# Patient Record
Sex: Female | Born: 1950 | Race: White | Hispanic: No | Marital: Single | Smoking: Never smoker
Health system: Southern US, Community
[De-identification: ages and names within clinical notes are randomized; demographics above are authoritative.]

## PROBLEM LIST (undated history)

## (undated) DIAGNOSIS — E119 Type 2 diabetes mellitus without complications: Secondary | ICD-10-CM

## (undated) DIAGNOSIS — K635 Polyp of colon: Secondary | ICD-10-CM

## (undated) DIAGNOSIS — E785 Hyperlipidemia, unspecified: Secondary | ICD-10-CM

## (undated) DIAGNOSIS — I10 Essential (primary) hypertension: Secondary | ICD-10-CM

## (undated) HISTORY — PX: OTHER SURGICAL HISTORY: SHX169

## (undated) HISTORY — PX: COLONOSCOPY: SHX174

---

## 2011-02-17 ENCOUNTER — Emergency Department: Payer: Self-pay | Admitting: *Deleted

## 2012-07-10 ENCOUNTER — Emergency Department: Payer: Self-pay | Admitting: Emergency Medicine

## 2012-07-10 LAB — CBC
HCT: 37.5 % (ref 35.0–47.0)
HGB: 13 g/dL (ref 12.0–16.0)
MCV: 89 fL (ref 80–100)
Platelet: 266 10*3/uL (ref 150–440)
RBC: 4.2 10*6/uL (ref 3.80–5.20)
RDW: 13.3 % (ref 11.5–14.5)
WBC: 6.5 10*3/uL (ref 3.6–11.0)

## 2012-07-10 LAB — BASIC METABOLIC PANEL
Anion Gap: 7 (ref 7–16)
Calcium, Total: 8.8 mg/dL (ref 8.5–10.1)
Chloride: 104 mmol/L (ref 98–107)
Co2: 28 mmol/L (ref 21–32)
Creatinine: 0.76 mg/dL (ref 0.60–1.30)
EGFR (African American): >60
EGFR (Non-African Amer.): >60
Glucose: 102 mg/dL — ABNORMAL HIGH (ref 65–99)
Osmolality: 279 (ref 275–301)
Sodium: 139 mmol/L (ref 136–145)

## 2012-07-10 LAB — TROPONIN I: Troponin-I: <0.02 ng/mL

## 2012-07-10 LAB — CK TOTAL AND CKMB (NOT AT ARMC): CK, Total: 65 U/L (ref 21–215)

## 2014-01-19 IMAGING — CR DG CHEST 2V
1 series · 2 of 2 positions shown · non-contrast
Comparison: none

REASON FOR EXAM: chest pain
COMMENTS:

PROCEDURE:     DXR - DXR CHEST PA (OR AP) AND LATERAL  - July 10, 2012 [DATE]
RESULT:     The lungs are clear. The heart and pulmonary vessels are normal.
The bony and mediastinal structures are unremarkable. There is no effusion.
There is no pneumothorax or evidence of congestive failure.

[Series 1: w chest pa · 0.14mm/px · 2 of 2 slices shown]
[im 1/2]
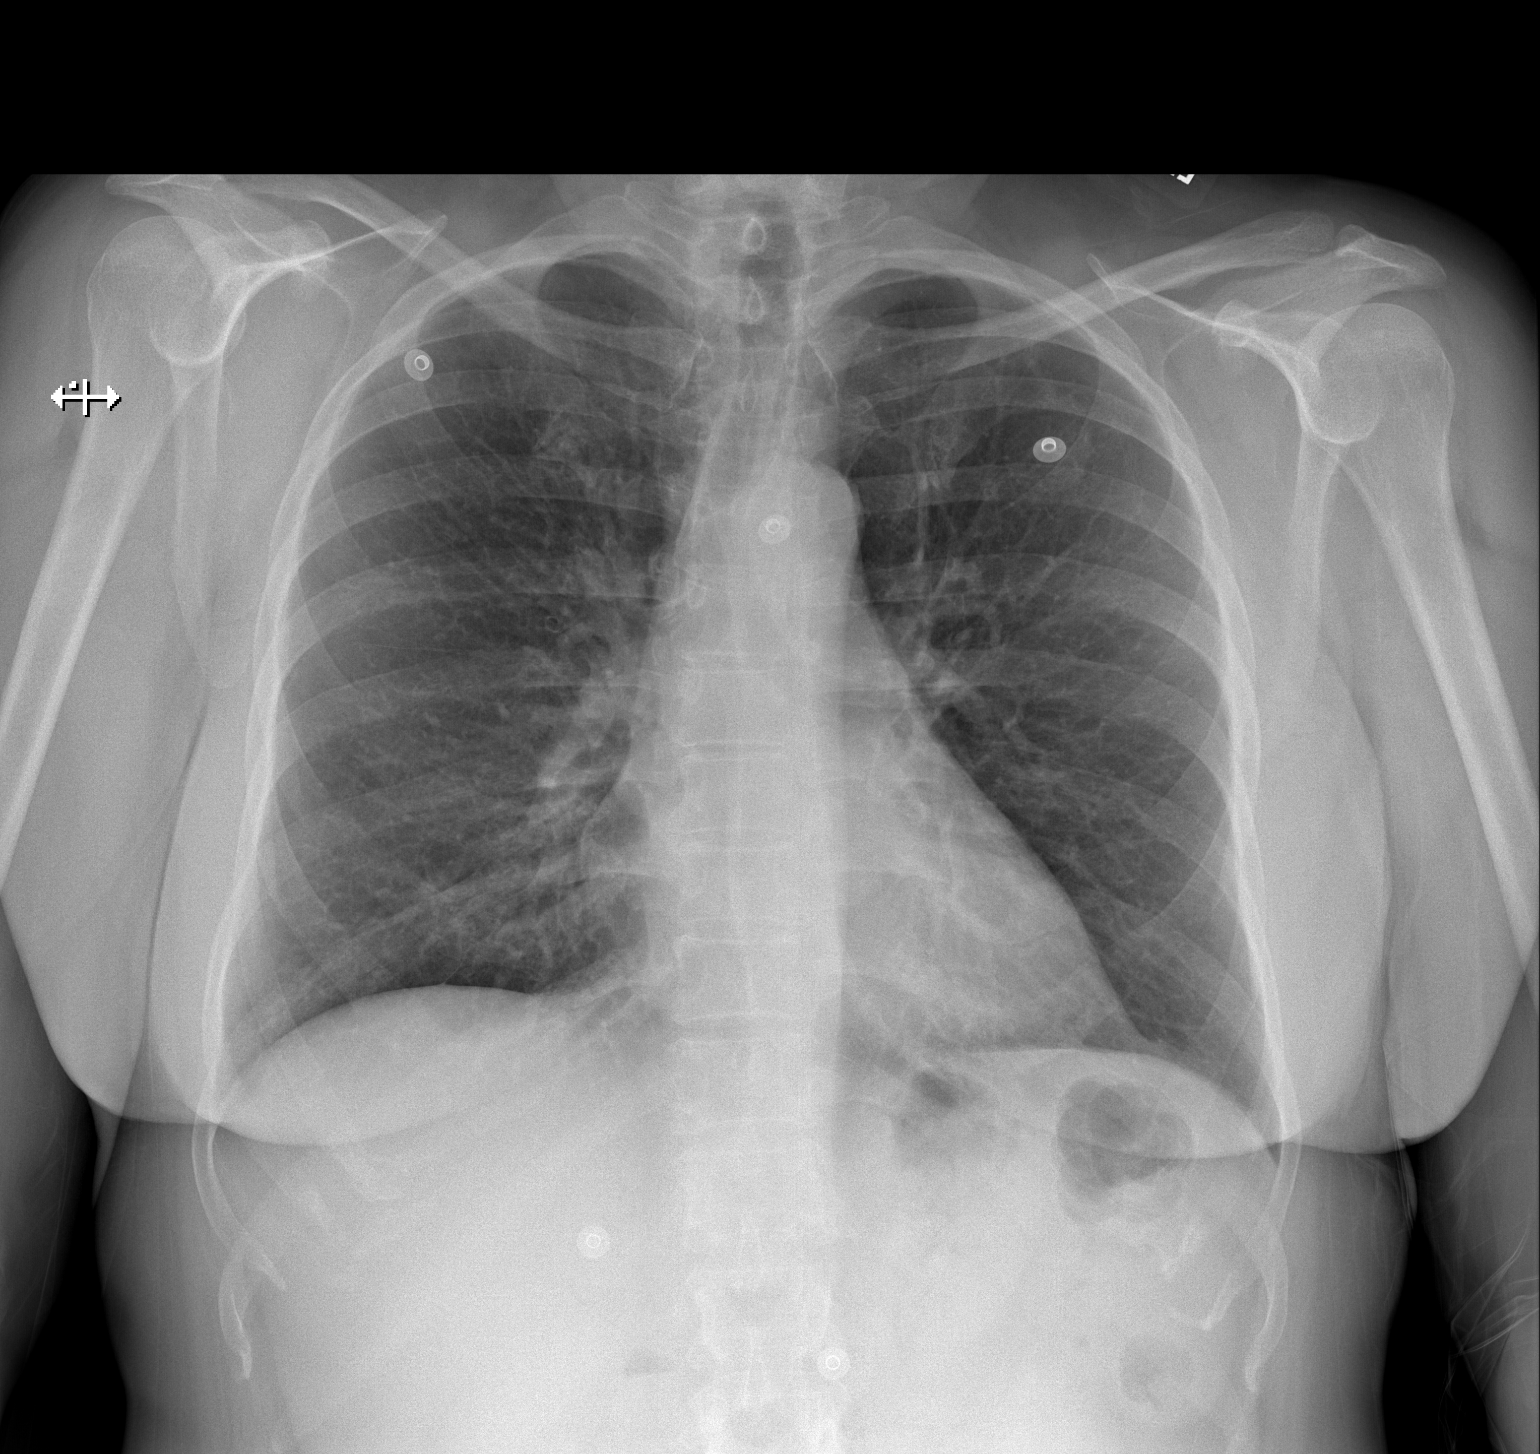
[im 2/2]
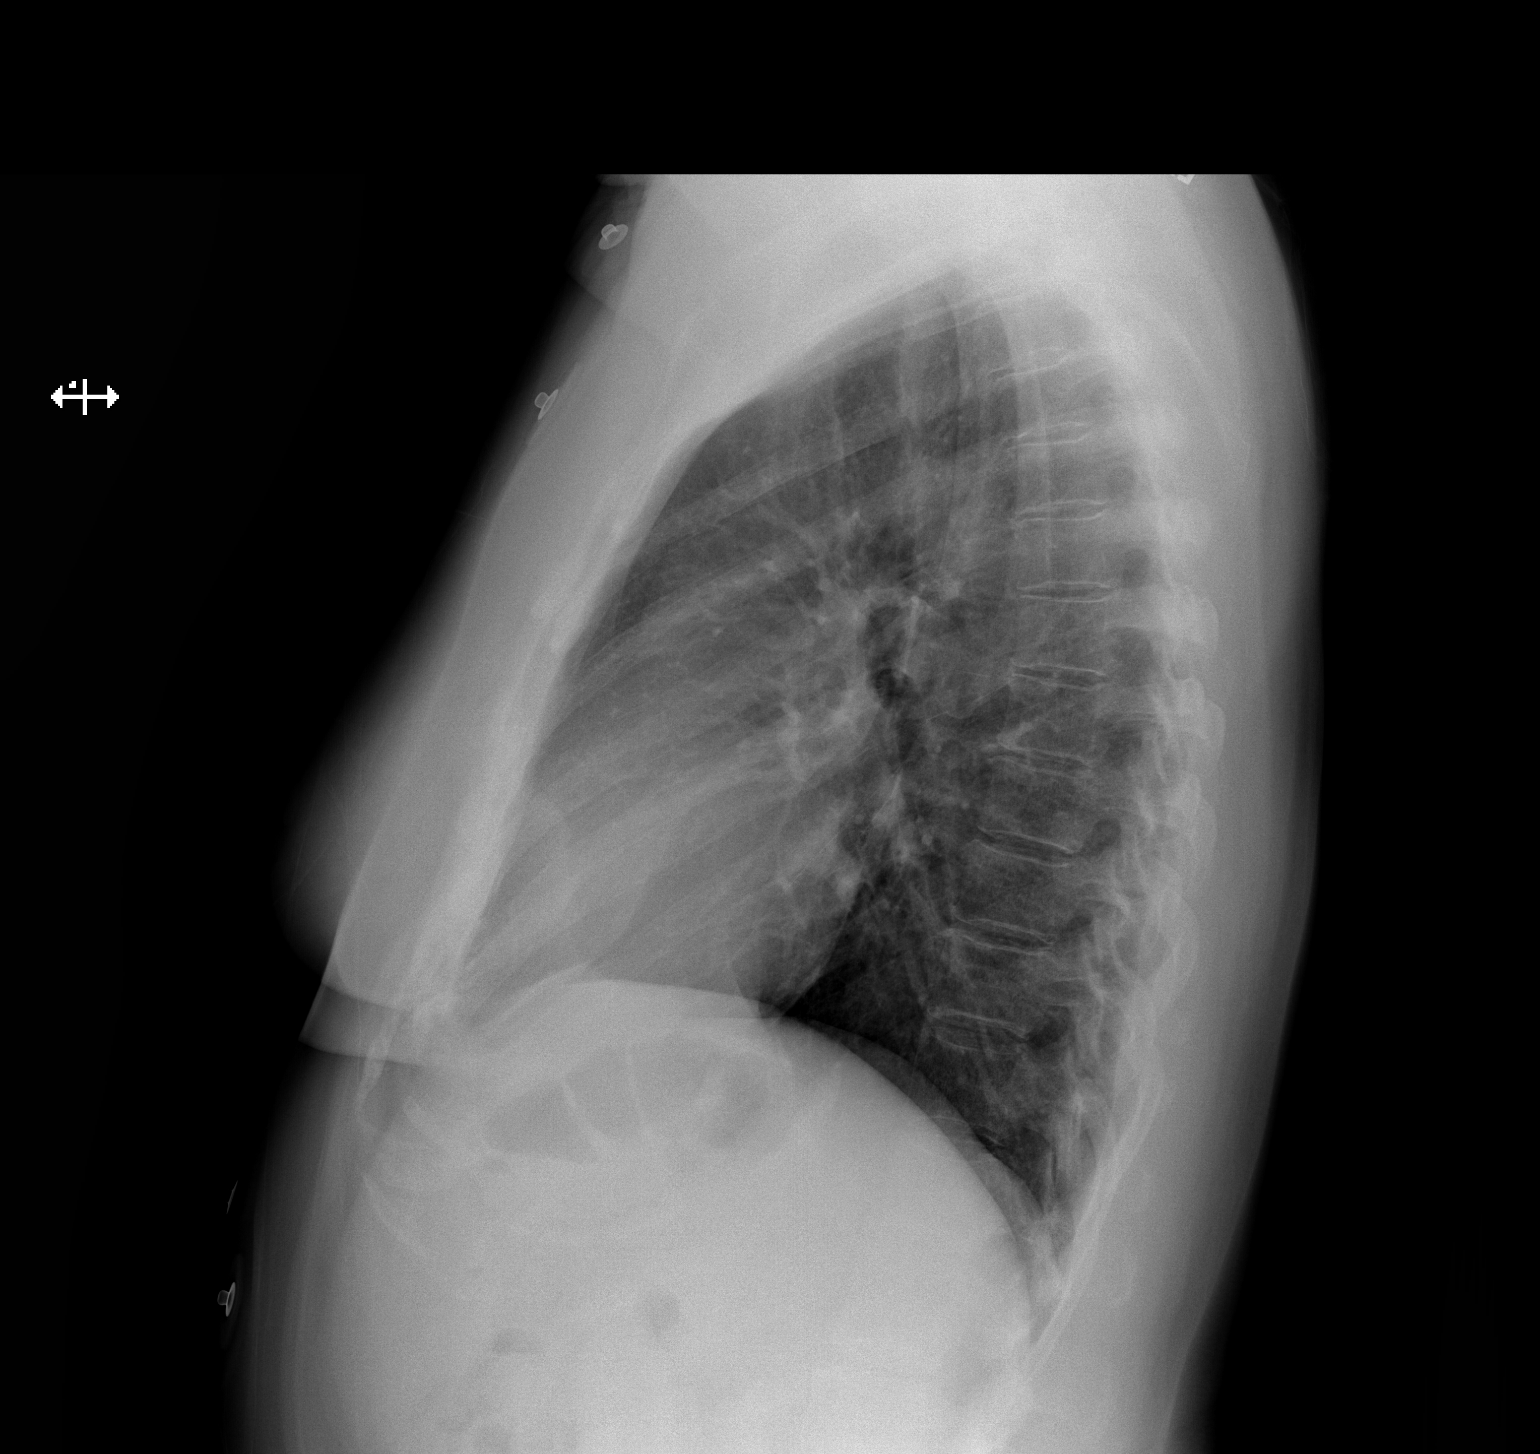

[2 of 2 positions shown; findings below may reference images not displayed]

IMPRESSION: No acute cardiopulmonary disease.

[REDACTED]

## 2015-01-24 ENCOUNTER — Other Ambulatory Visit: Payer: Self-pay | Admitting: Nurse Practitioner

## 2015-01-24 DIAGNOSIS — Z1231 Encounter for screening mammogram for malignant neoplasm of breast: Secondary | ICD-10-CM

## 2015-04-09 ENCOUNTER — Encounter: Payer: Self-pay | Admitting: *Deleted

## 2015-04-10 ENCOUNTER — Ambulatory Visit: Payer: No Typology Code available for payment source | Admitting: Certified Registered Nurse Anesthetist

## 2015-04-10 ENCOUNTER — Encounter: Payer: Self-pay | Admitting: *Deleted

## 2015-04-10 ENCOUNTER — Encounter
Admission: RE | Disposition: A | Discharge status: Home / Self Care | Payer: Self-pay | Source: Ambulatory Visit | Attending: Gastroenterology

## 2015-04-10 ENCOUNTER — Ambulatory Visit
Admission: RE | Admit: 2015-04-10 | Discharge: 2015-04-10 | Disposition: A | Discharge status: Home / Self Care | Payer: No Typology Code available for payment source | Source: Ambulatory Visit | Attending: Gastroenterology | Admitting: Gastroenterology

## 2015-04-10 DIAGNOSIS — K644 Residual hemorrhoidal skin tags: Secondary | ICD-10-CM | POA: Diagnosis not present

## 2015-04-10 DIAGNOSIS — I10 Essential (primary) hypertension: Secondary | ICD-10-CM | POA: Diagnosis not present

## 2015-04-10 DIAGNOSIS — E119 Type 2 diabetes mellitus without complications: Secondary | ICD-10-CM | POA: Diagnosis not present

## 2015-04-10 DIAGNOSIS — Z800 Family history of malignant neoplasm of digestive organs: Secondary | ICD-10-CM | POA: Insufficient documentation

## 2015-04-10 DIAGNOSIS — Z79899 Other long term (current) drug therapy: Secondary | ICD-10-CM | POA: Diagnosis not present

## 2015-04-10 DIAGNOSIS — Z86010 Personal history of colonic polyps: Secondary | ICD-10-CM | POA: Insufficient documentation

## 2015-04-10 DIAGNOSIS — E785 Hyperlipidemia, unspecified: Secondary | ICD-10-CM | POA: Insufficient documentation

## 2015-04-10 DIAGNOSIS — K5730 Diverticulosis of large intestine without perforation or abscess without bleeding: Secondary | ICD-10-CM | POA: Insufficient documentation

## 2015-04-10 DIAGNOSIS — R195 Other fecal abnormalities: Secondary | ICD-10-CM | POA: Insufficient documentation

## 2015-04-10 DIAGNOSIS — Z7984 Long term (current) use of oral hypoglycemic drugs: Secondary | ICD-10-CM | POA: Insufficient documentation

## 2015-04-10 HISTORY — DX: Hyperlipidemia, unspecified: E78.5

## 2015-04-10 HISTORY — PX: COLONOSCOPY WITH PROPOFOL: SHX5780

## 2015-04-10 HISTORY — DX: Essential (primary) hypertension: I10

## 2015-04-10 HISTORY — DX: Polyp of colon: K63.5

## 2015-04-10 HISTORY — DX: Type 2 diabetes mellitus without complications: E11.9

## 2015-04-10 LAB — GLUCOSE, CAPILLARY: Glucose-Capillary: 98 mg/dL (ref 65–99)

## 2015-04-10 SURGERY — COLONOSCOPY WITH PROPOFOL
Anesthesia: General

## 2015-04-10 MED ORDER — SODIUM CHLORIDE 0.9 % IV SOLN
INTRAVENOUS | Status: DC
  Administered 2015-04-10: 11:00:00 via INTRAVENOUS

## 2015-04-10 MED ORDER — PROPOFOL 10 MG/ML IV BOLUS
INTRAVENOUS | Status: DC | PRN
  Administered 2015-04-10: 60 mg via INTRAVENOUS

## 2015-04-10 MED ORDER — PROPOFOL 500 MG/50ML IV EMUL
INTRAVENOUS | Status: DC | PRN
  Administered 2015-04-10: 140 ug/kg/min via INTRAVENOUS

## 2015-04-10 MED ORDER — SODIUM CHLORIDE 0.9 % IV SOLN: INTRAVENOUS | Status: DC

## 2015-04-10 NOTE — Op Note (Signed)
[facility] Gastroenterology Patient Name: Suzanne Gardner Procedure Date: 04/10/2015 10:58 AM MRN: [id number] Account #: [id number] Date of Birth: 30-Sep-1950 Admit Type: Outpatient Age: 64 Room: [facility] ENDO ROOM 4 Gender: Female Note Status: Finalized Procedure:         Colonoscopy Indications:       Heme positive stool, Family history of colon cancer in a                     first-degree relative, Personal history of colonic polyps Providers:         Ezzard Standing. Bluford Kaufmann, MD Referring MD:      Valentina Shaggy. White (Referring MD) Medicines:         Monitored Anesthesia Care Complications:     No immediate complications. Procedure:         Pre-Anesthesia Assessment:                    - Prior to the procedure, a History and Physical was                     performed, and patient medications, allergies and                     sensitivities were reviewed. The patient's tolerance of                     previous anesthesia was reviewed.                    - The risks and benefits of the procedure and the sedation                     options and risks were discussed with the patient. All                     questions were answered and informed consent was obtained.                    - After reviewing the risks and benefits, the patient was                     deemed in satisfactory condition to undergo the procedure.                    After obtaining informed consent, the colonoscope was                     passed under direct vision. Throughout the procedure, the                     patient's blood pressure, pulse, and oxygen saturations                     were monitored continuously. The Colonoscope was                     introduced through the anus and advanced to the the cecum,                     identified by appendiceal orifice and ileocecal valve. The                     colonoscopy was performed without difficulty. The patient  tolerated the procedure  well. The quality of the bowel                     preparation was good. Findings:      The perianal exam findings include non-thrombosed external hemorrhoids.      A few small-mouthed diverticula were found in the sigmoid colon.      The exam was otherwise without abnormality. Impression:        - Non-thrombosed external hemorrhoids found on perianal                     exam.                    - Diverticulosis in the sigmoid colon.                    - The examination was otherwise normal.                    - No specimens collected. Recommendation:    - Discharge patient to home.                    - Repeat colonoscopy in 5 years for surveillance.                    - The findings and recommendations were discussed with the                     patient. Procedure Code(s): --- Professional ---                    [id number], Colonoscopy, flexible; diagnostic, including                     collection of specimen(s) by brushing or washing, when                     performed (separate procedure) Diagnosis Code(s): --- Professional ---                    K64.4, Residual hemorrhoidal skin tags                    R19.5, Other fecal abnormalities                    Z80.0, Family history of malignant neoplasm of digestive                     organs                    Z86.010, Personal history of colonic polyps                    K57.30, Diverticulosis of large intestine without                     perforation or abscess without bleeding CPT copyright 2014 American [employer] Association. All rights reserved. The codes documented in this report are preliminary and upon coder review may  be revised to meet current compliance requirements. Wallace CullensPaul Y Kista Robb, MD 04/10/2015 11:16:20 AM This report has been signed electronically. Number of Addenda: 0 Note Initiated On: 04/10/2015 10:58 AM Scope Withdrawal Time: 0 hours 5 minutes 40 seconds  Total Procedure Duration: 0 hours 11 minutes 10 seconds  [facility]

## 2015-04-10 NOTE — Anesthesia Preprocedure Evaluation (Signed)
Anesthesia Evaluation  Patient identified by MRN, date of birth, ID band Patient awake    Reviewed: Allergy & Precautions, H&P , NPO status , Patient's Chart, lab work & pertinent test results  History of Anesthesia Complications Negative for: history of anesthetic complications  Airway Mallampati: III  TM Distance: >3 FB Neck ROM: limited    Dental  (+) Poor Dentition, Chipped, Missing   Pulmonary neg shortness of breath, sleep apnea ,    Pulmonary exam normal breath sounds clear to auscultation       Cardiovascular Exercise Tolerance: Good hypertension, (-) angina(-) DOE Normal cardiovascular exam(-) Valvular Problems/Murmurs Rhythm:regular Rate:Normal     Neuro/Psych negative neurological ROS  negative psych ROS   GI/Hepatic negative GI ROS, Neg liver ROS,   Endo/Other  diabetes, Type 2  Renal/GU negative Renal ROS  negative genitourinary   Musculoskeletal   Abdominal   Peds  Hematology negative hematology ROS (+)   Anesthesia Other Findings Past Medical History:   Diabetes mellitus without complication (HCC)                 Hypertension                                                 Hyperlipidemia                                               Colon polyps                                                Past Surgical History:   cervix stitched                                               COLONOSCOPY                                                  BMI    Body Mass Index   31.83 kg/m 2      Reproductive/Obstetrics negative OB ROS                             Anesthesia Physical Anesthesia Plan  ASA: III  Anesthesia Plan: General   Post-op Pain Management:    Induction:   Airway Management Planned:   Additional Equipment:   Intra-op Plan:   Post-operative Plan:   Informed Consent: I have reviewed the patients History and Physical, chart, labs and discussed the  procedure including the risks, benefits and alternatives for the proposed anesthesia with the patient or authorized representative who has indicated his/her understanding and acceptance.   Dental Advisory Given  Plan Discussed with: Anesthesiologist, CRNA and Surgeon  Anesthesia Plan Comments:         Anesthesia Quick Evaluation

## 2015-04-10 NOTE — Transfer of Care (Signed)
Immediate Anesthesia Transfer of Care Note  Patient: Suzanne RiasCathy Bou  Procedure(s) Performed: Procedure(s): COLONOSCOPY WITH PROPOFOL (N/A)  Patient Location: PACU  Anesthesia Type:General  Level of Consciousness: awake, alert  and oriented  Airway & Oxygen Therapy: Patient Spontanous Breathing  Post-op Assessment: Report given to RN  Post vital signs: Reviewed and stable  Last Vitals:  Filed Vitals:   04/10/15 1117  BP: 132/63  Pulse: 75  Temp: 36.1 C  Resp: 24    Complications: No apparent anesthesia complications

## 2015-04-10 NOTE — Anesthesia Postprocedure Evaluation (Signed)
  Anesthesia Post-op Note  Patient: Suzanne RiasCathy Gardner  Procedure(s) Performed: Procedure(s): COLONOSCOPY WITH PROPOFOL (N/A)  Anesthesia type:General  Patient location: PACU  Post pain: Pain level controlled  Post assessment: Post-op Vital signs reviewed, Patient's Cardiovascular Status Stable, Respiratory Function Stable, Patent Airway and No signs of Nausea or vomiting  Post vital signs: Reviewed and stable  Last Vitals:  Filed Vitals:   04/10/15 1120  BP: 132/63  Pulse: 75  Temp: 36 C  Resp: 19    Level of consciousness: awake, alert  and patient cooperative  Complications: No apparent anesthesia complications

## 2015-04-10 NOTE — Anesthesia Procedure Notes (Signed)
Performed by: Ginger CarneMICHELET, Edgar Reisz Pre-anesthesia Checklist: Patient identified and Emergency Drugs available Patient Re-evaluated:Patient Re-evaluated prior to inductionOxygen Delivery Method: Nasal cannula Placement Confirmation: positive ETCO2

## 2015-04-10 NOTE — H&P (Signed)
    Primary Care Physician:  WHITE, Valentina ShaggyHRISTINA M, FNP Primary Gastroenterologist:  Dr. Bluford Kaufmannh  Pre-Procedure History & Physical: HPI:  Suzanne Gardner is a 64 y.o. female is here for an colonoscopy.   Past Medical History  Diagnosis Date  . Diabetes mellitus without complication (HCC)   . Hypertension   . Hyperlipidemia   . Colon polyps     Past Surgical History  Procedure Laterality Date  . Cervix stitched    . Colonoscopy      Prior to Admission medications   Medication Sig Start Date End Date Taking? Authorizing Provider  cetirizine (ZYRTEC) 10 MG tablet Take 10 mg by mouth daily.   Yes Historical Provider, MD  hydrochlorothiazide (HYDRODIURIL) 25 MG tablet Take 25 mg by mouth daily.   Yes Historical Provider, MD  lisinopril (PRINIVIL,ZESTRIL) 10 MG tablet Take 10 mg by mouth daily.   Yes Historical Provider, MD  metFORMIN (GLUCOPHAGE) 500 MG tablet Take by mouth 2 (two) times daily with a meal.   Yes Historical Provider, MD  pravastatin (PRAVACHOL) 20 MG tablet Take 20 mg by mouth daily.   Yes Historical Provider, MD    Allergies as of 03/11/2015  . (Not on File)    History reviewed. No pertinent family history.  Social History   Social History  . Marital Status: Single    Spouse Name: N/A  . Number of Children: N/A  . Years of Education: N/A   Occupational History  . Not on file.   Social History Main Topics  . Smoking status: Never Smoker   . Smokeless tobacco: Never Used  . Alcohol Use: No  . Drug Use: No  . Sexual Activity: Not on file   Other Topics Concern  . Not on file   Social History Narrative    Review of Systems: See HPI, otherwise negative ROS  Physical Exam: BP 150/73 mmHg  Pulse 70  Temp(Src) 97.5 F (36.4 C) (Tympanic)  Resp 12  Ht 5' (1.524 m)  Wt 73.936 kg (163 lb)  BMI 31.83 kg/m2  SpO2 100% General:   Alert,  pleasant and cooperative in NAD Head:  Normocephalic and atraumatic. Neck:  Supple; no masses or thyromegaly. Lungs:   Clear throughout to auscultation.    Heart:  Regular rate and rhythm. Abdomen:  Soft, nontender and nondistended. Normal bowel sounds, without guarding, and without rebound.   Neurologic:  Alert and  oriented x4;  grossly normal neurologically.  Impression/Plan: Suzanne Gardner is here for an colonoscopy to be performed for personal hx of colon polyps, family hx of colon cancer, and heme positive stool.  Risks, benefits, limitations, and alternatives regarding  colonoscopy have been reviewed with the patient.  Questions have been answered.  All parties agreeable.   Jaire Pinkham, Ezzard StandingPAUL Y, MD  04/10/2015, 10:05 AM

## 2015-04-11 ENCOUNTER — Encounter: Payer: Self-pay | Admitting: Gastroenterology

## 2015-10-31 ENCOUNTER — Other Ambulatory Visit: Payer: Self-pay | Admitting: Preventative Medicine

## 2015-10-31 DIAGNOSIS — Z1231 Encounter for screening mammogram for malignant neoplasm of breast: Secondary | ICD-10-CM

## 2015-12-08 ENCOUNTER — Other Ambulatory Visit: Payer: Self-pay | Admitting: Preventative Medicine

## 2015-12-08 ENCOUNTER — Ambulatory Visit
Admission: RE | Admit: 2015-12-08 | Discharge: 2015-12-08 | Disposition: A | Discharge status: Home / Self Care | Payer: BLUE CROSS/BLUE SHIELD | Source: Ambulatory Visit | Attending: Preventative Medicine | Admitting: Preventative Medicine

## 2015-12-08 DIAGNOSIS — Z1231 Encounter for screening mammogram for malignant neoplasm of breast: Secondary | ICD-10-CM | POA: Diagnosis not present

## 2017-06-18 IMAGING — MG MM DIGITAL SCREENING BILAT W/ TOMO W/ CAD
9 of 17 series · 9 of 40 positions shown · non-contrast
Comparison: Prior mammograms dated 04/15/2005 and 08/07/2009.

CLINICAL DATA: Screening.

EXAM:
2D DIGITAL SCREENING BILATERAL MAMMOGRAM WITH CAD AND ADJUNCT TOMO

[R CC synth-2D (1 of 2)]
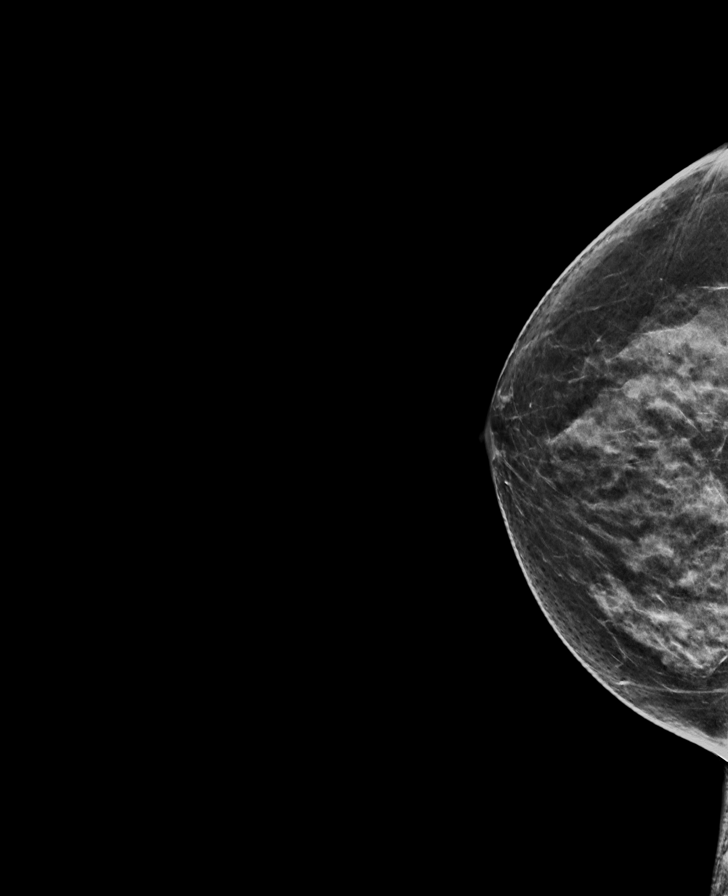

[L CC synth-2D]
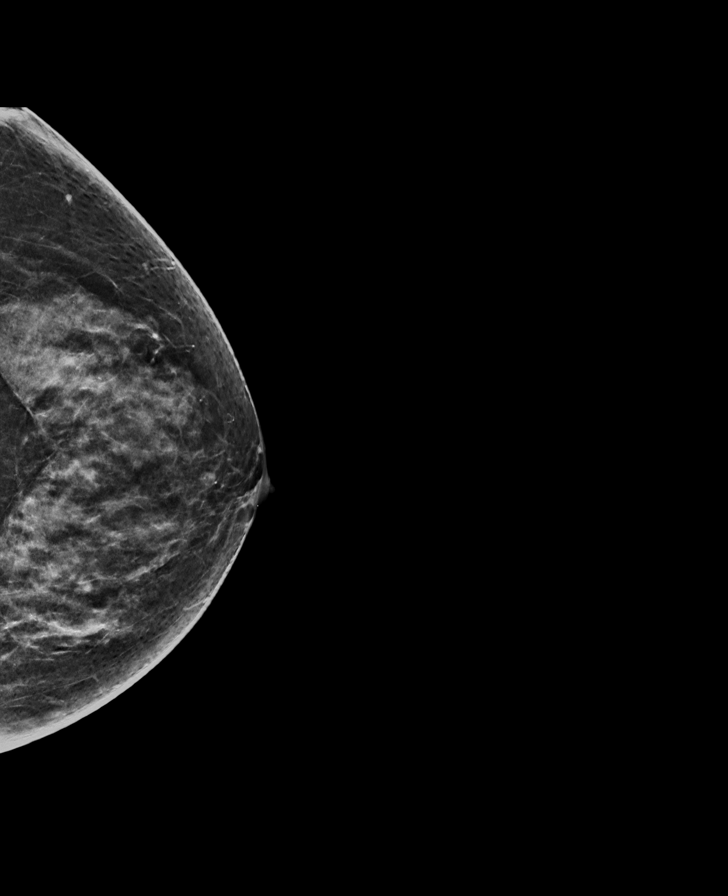

[R MLO]
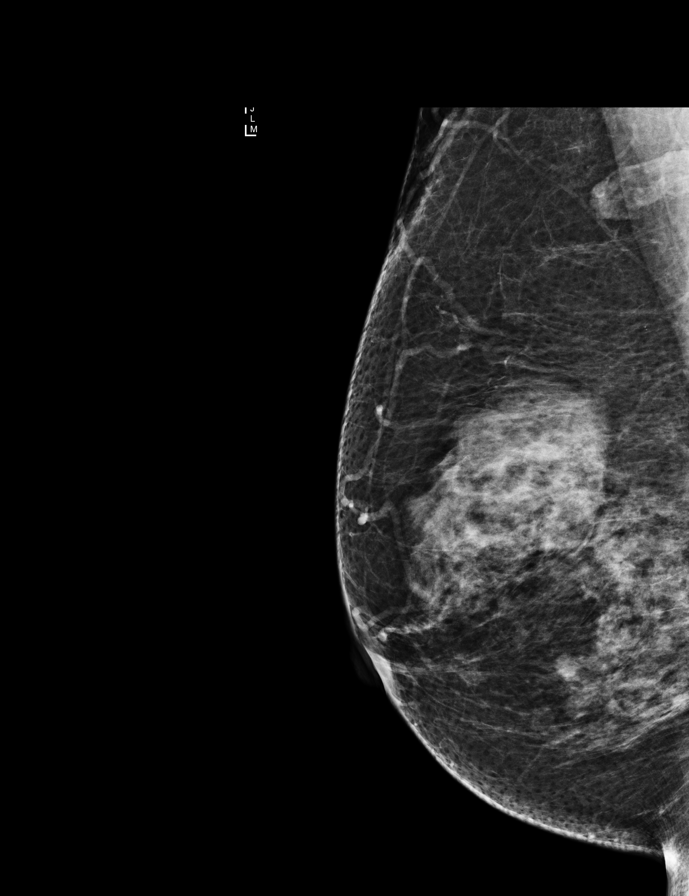

[R CC synth-2D (2 of 2)]
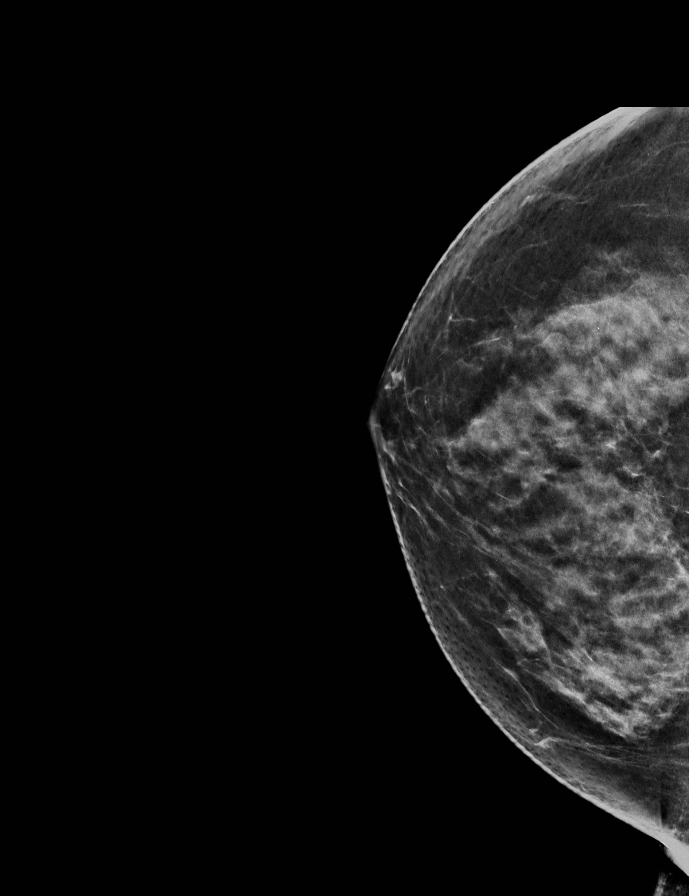

[L MLO synth-2D]
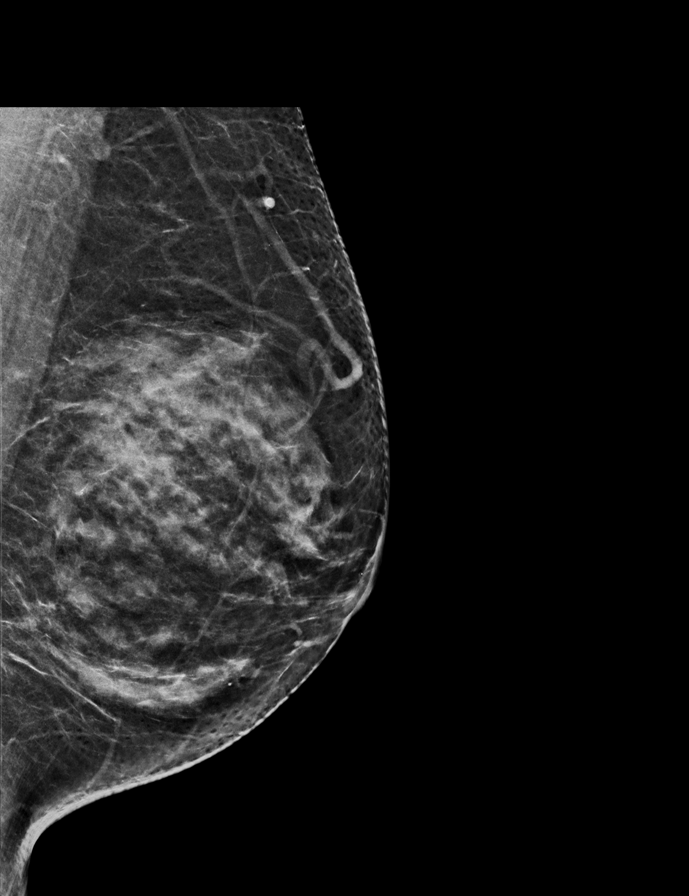

[R CC]
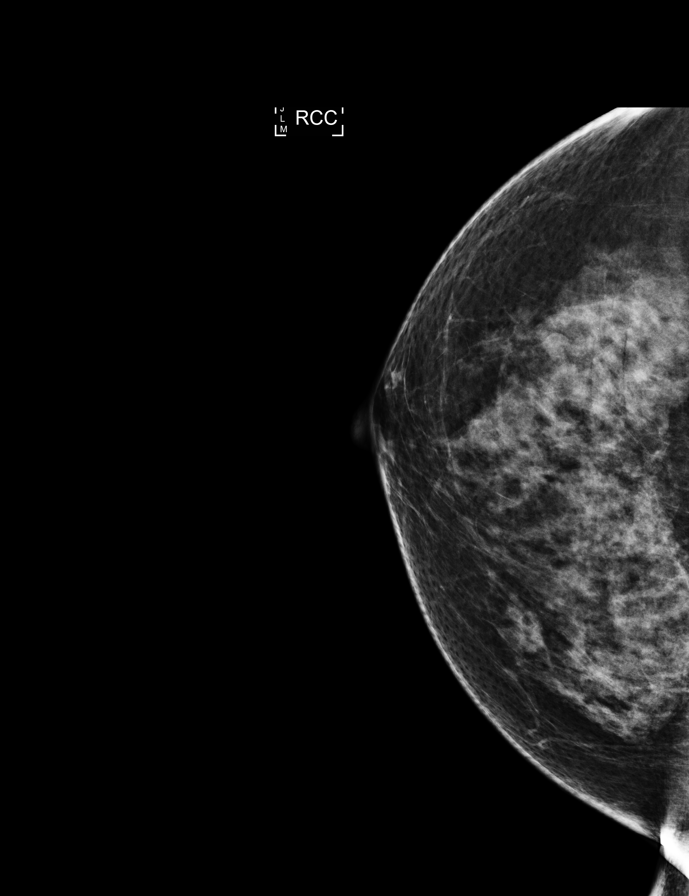

[L CC]
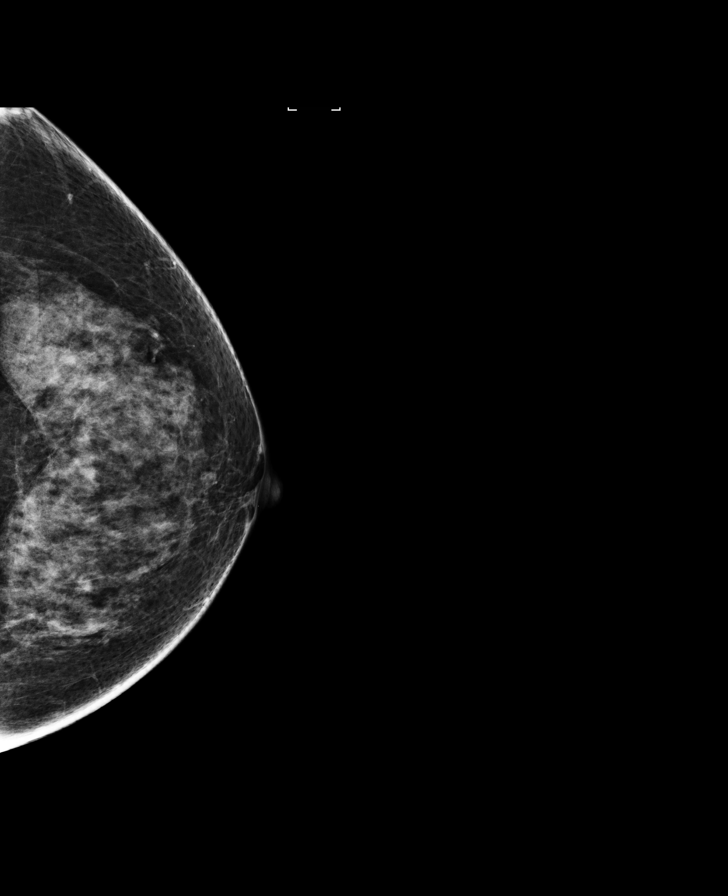

[R XCCL synth-2D]
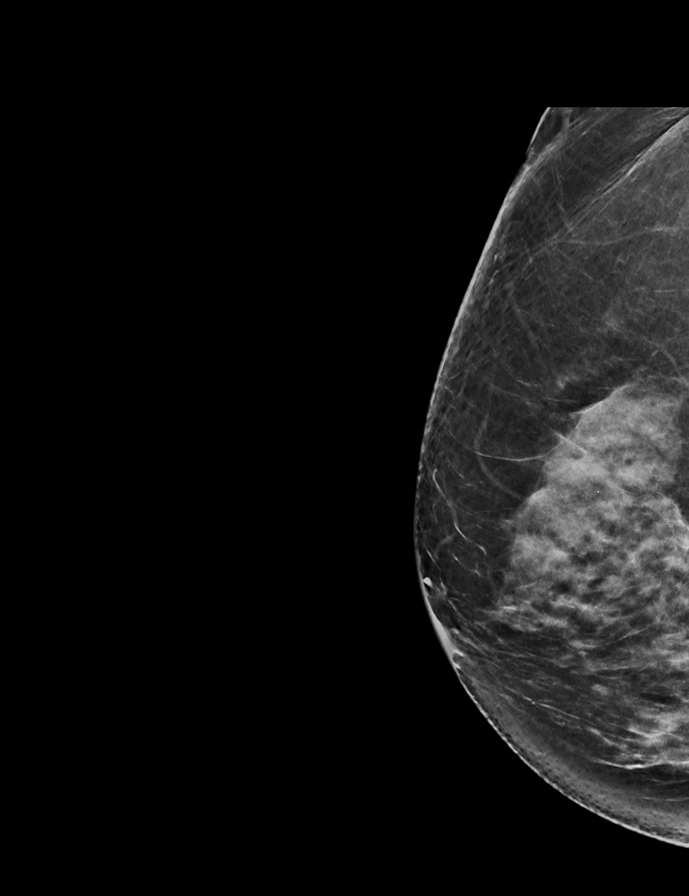

[R MLO synth-2D]
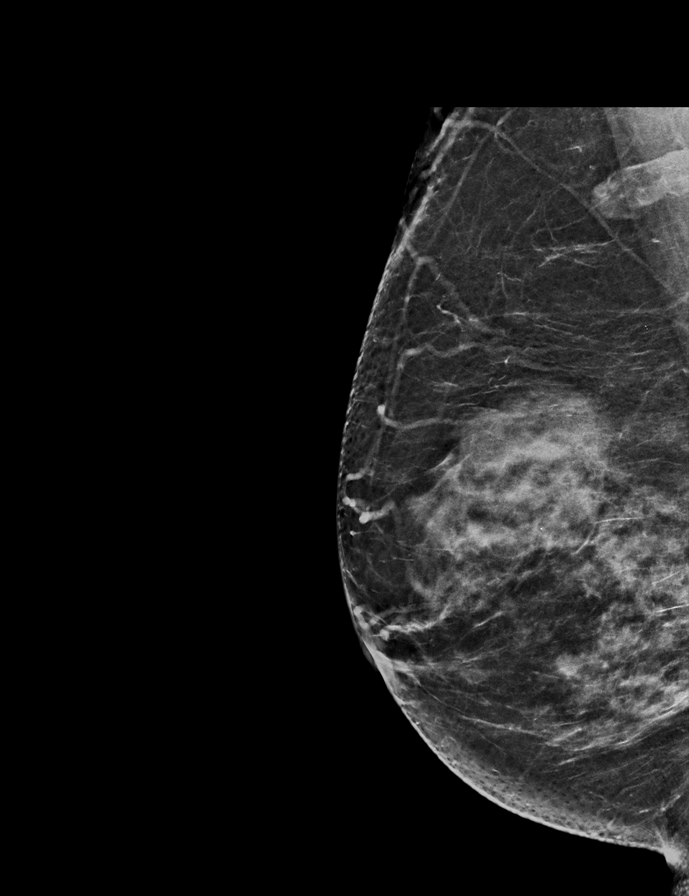

[9 of 40 positions shown; findings below may reference images not displayed]

ACR Breast Density Category c: The breast tissue is heterogeneously
dense, which may obscure small masses.
FINDINGS: There are no findings suspicious for malignancy. Images were
processed with CAD.
IMPRESSION: No mammographic evidence of malignancy. A result letter of this
screening mammogram will be mailed directly to the patient.

RECOMMENDATION:
Screening mammogram in one year. (Code:W5-W-Z4C)

BI-RADS CATEGORY  1: Negative.

## 2018-01-13 ENCOUNTER — Other Ambulatory Visit: Payer: Self-pay | Admitting: Family Medicine

## 2018-01-13 DIAGNOSIS — Z1231 Encounter for screening mammogram for malignant neoplasm of breast: Secondary | ICD-10-CM

## 2018-10-10 ENCOUNTER — Encounter: Payer: Self-pay | Admitting: *Deleted

## 2018-10-10 ENCOUNTER — Emergency Department
Admission: EM | Admit: 2018-10-10 | Discharge: 2018-10-10 | Disposition: A | Discharge status: Home / Self Care | Payer: Medicare Other | Attending: Emergency Medicine | Admitting: Emergency Medicine

## 2018-10-10 ENCOUNTER — Other Ambulatory Visit: Payer: Self-pay

## 2018-10-10 ENCOUNTER — Emergency Department: Payer: Medicare Other

## 2018-10-10 DIAGNOSIS — Z79899 Other long term (current) drug therapy: Secondary | ICD-10-CM | POA: Insufficient documentation

## 2018-10-10 DIAGNOSIS — Z7984 Long term (current) use of oral hypoglycemic drugs: Secondary | ICD-10-CM | POA: Insufficient documentation

## 2018-10-10 DIAGNOSIS — E119 Type 2 diabetes mellitus without complications: Secondary | ICD-10-CM | POA: Diagnosis not present

## 2018-10-10 DIAGNOSIS — I10 Essential (primary) hypertension: Secondary | ICD-10-CM | POA: Insufficient documentation

## 2018-10-10 DIAGNOSIS — R079 Chest pain, unspecified: Secondary | ICD-10-CM | POA: Diagnosis present

## 2018-10-10 DIAGNOSIS — Z7982 Long term (current) use of aspirin: Secondary | ICD-10-CM | POA: Insufficient documentation

## 2018-10-10 LAB — BASIC METABOLIC PANEL
Anion gap: 15 (ref 5–15)
BUN: 27 mg/dL — ABNORMAL HIGH (ref 8–23)
CO2: 25 mmol/L (ref 22–32)
Calcium: 9.6 mg/dL (ref 8.9–10.3)
Chloride: 91 mmol/L — ABNORMAL LOW (ref 98–111)
Creatinine, Ser: 1.4 mg/dL — ABNORMAL HIGH (ref 0.44–1.00)
GFR calc Af Amer: 46 mL/min — ABNORMAL LOW (ref >60)
GFR calc non Af Amer: 39 mL/min — ABNORMAL LOW (ref >60)
Glucose, Bld: 122 mg/dL — ABNORMAL HIGH (ref 70–99)
Potassium: 3.6 mmol/L (ref 3.5–5.1)
Sodium: 131 mmol/L — ABNORMAL LOW (ref 135–145)

## 2018-10-10 LAB — CBC WITH DIFFERENTIAL/PLATELET
Abs Immature Granulocytes: 0.04 10*3/uL (ref 0.00–0.07)
Basophils Absolute: 0.1 10*3/uL (ref 0.0–0.1)
Basophils Relative: 1 %
Eosinophils Absolute: 0.1 10*3/uL (ref 0.0–0.5)
Eosinophils Relative: 1 %
HCT: 36.9 % (ref 36.0–46.0)
Hemoglobin: 12.4 g/dL (ref 12.0–15.0)
Immature Granulocytes: 0 %
Lymphocytes Relative: 27 %
Lymphs Abs: 2.8 10*3/uL (ref 0.7–4.0)
MCH: 29.1 pg (ref 26.0–34.0)
MCHC: 33.6 g/dL (ref 30.0–36.0)
MCV: 86.6 fL (ref 80.0–100.0)
Monocytes Absolute: 0.8 10*3/uL (ref 0.1–1.0)
Monocytes Relative: 7 %
Neutro Abs: 6.8 10*3/uL (ref 1.7–7.7)
Neutrophils Relative %: 64 %
Platelets: 443 10*3/uL — ABNORMAL HIGH (ref 150–400)
RBC: 4.26 MIL/uL (ref 3.87–5.11)
RDW: 12.1 % (ref 11.5–15.5)
WBC: 10.6 10*3/uL — ABNORMAL HIGH (ref 4.0–10.5)
nRBC: 0 % (ref 0.0–0.2)

## 2018-10-10 LAB — TROPONIN I
Troponin I: <0.03 ng/mL (ref <0.03)
Troponin I: <0.03 ng/mL (ref <0.03)

## 2018-10-10 MED ORDER — ASPIRIN 81 MG PO TABS: 81.0000 mg | ORAL_TABLET | Freq: Every day | ORAL | 0 refills | Status: AC

## 2018-10-10 MED ORDER — SODIUM CHLORIDE 0.9 % IV BOLUS: 500.0000 mL | Freq: Once | INTRAVENOUS | Status: DC | PRN

## 2018-10-10 MED ORDER — NITROGLYCERIN 0.4 MG SL SUBL
0.4000 mg | SUBLINGUAL_TABLET | SUBLINGUAL | Status: DC | PRN
  Administered 2018-10-10 (×3): 0.4 mg via SUBLINGUAL
  Filled 2018-10-10: qty 1

## 2018-10-10 MED ORDER — ASPIRIN 81 MG PO CHEW
324.0000 mg | CHEWABLE_TABLET | Freq: Once | ORAL | Status: AC
  Administered 2018-10-10: 16:00:00 324 mg via ORAL
  Filled 2018-10-10: qty 4

## 2018-10-10 NOTE — Discharge Instructions (Signed)

## 2018-10-10 NOTE — ED Triage Notes (Signed)
Pt to triage via wheelchair.  Pt chest tightness.  Intermittent pain in center of chest.  Pt has sob.  Nonsmoker. Pt reports nausea this am.  Pt alert.  Speech clear.

## 2018-10-10 NOTE — ED Notes (Signed)
Pt assisted to bedside commode. Pt with steady gait. Pt denies any dizziness at this time. This RN assisted pt back to bed. This RN will continue to monitor.

## 2018-10-10 NOTE — ED Provider Notes (Signed)
[facility] Emergency Department Provider Note  ____________________________________________  Time seen: Approximately 4:38 PM  I have reviewed the triage vital signs and the nursing notes.   HISTORY  Chief Complaint Chest Pain   HPI Suzanne Gardner is a 68 y.o. female with a history of type 2 diabetes, GERD, hypertension hyperlipidemia who presents for evaluation of chest pain.  Patient reports 3 days of intermittent squeezing/pressure in the center of her chest.  These episodes happen both at rest and with walking inside the house.  No shortness of breath, no dizziness, no cough, no fever.  Patient is not sure how long these episodes last.  She denies mild nausea associated with it.  She denies ever having similar episodes in the past.  She denies personal history of heart attacks however her father had coronary artery disease and a CABG.  She has no history of smoking.  No personal family history blood clots, recent travel immobilization, leg pain or swelling, hemoptysis, exogenous hormones, history of cancer.  No abdominal pain.  At this time patient is complaining moderate pain.  The pain does not radiate to her back.  No known exposures to COVID-19 or travel to endemic regions.  Past Medical History:  Diagnosis Date  . Colon polyps   . Diabetes mellitus without complication (HCC)   . Hyperlipidemia   . Hypertension     There are no active problems to display for this patient.   Past Surgical History:  Procedure Laterality Date  . cervix stitched    . COLONOSCOPY    . COLONOSCOPY WITH PROPOFOL N/A 04/10/2015   Procedure: COLONOSCOPY WITH PROPOFOL;  Surgeon: Wallace Cullens, MD;  Location: [facility] ENDOSCOPY;  Service: Gastroenterology;  Laterality: N/A;    Prior to Admission medications   Medication Sig Start Date End Date Taking? Authorizing Provider  cetirizine (ZYRTEC) 10 MG tablet Take 10 mg by mouth daily.   Yes [provider]  fenofibrate 160  MG tablet Take 160 mg by mouth daily. 07/08/17  Yes [provider]  gabapentin (NEURONTIN) 300 MG capsule Take 300 mg by mouth 2 (two) times a day. 07/08/17  Yes [provider]  hydrochlorothiazide (HYDRODIURIL) 25 MG tablet Take 25 mg by mouth daily.   Yes [provider]  lisinopril (PRINIVIL,ZESTRIL) 10 MG tablet Take 10 mg by mouth daily.   Yes [provider]  metFORMIN (GLUCOPHAGE) 500 MG tablet Take 500 mg by mouth 2 (two) times daily with a meal.    Yes [provider]  Omega 3 1000 MG CAPS Take 1,000 mg by mouth daily.   Yes [provider]  omeprazole (PRILOSEC) 20 MG capsule Take 20 mg by mouth daily. 08/12/18  Yes [provider]  pravastatin (PRAVACHOL) 20 MG tablet Take 20 mg by mouth daily.   Yes [provider]  aspirin 81 MG tablet Take 1 tablet (81 mg total) by mouth daily. 10/10/18   Nita Sickle, MD    Allergies Shellfish allergy and Septra [sulfamethoxazole-trimethoprim]  Family History  Problem Relation Age of Onset  . Breast cancer Mother     Social History Social History   Tobacco Use  . Smoking status: Never Smoker  . Smokeless tobacco: Never Used  Substance Use Topics  . Alcohol use: No  . Drug use: No    Review of Systems  Constitutional: Negative for fever. Eyes: Negative for visual changes. ENT: Negative for sore throat. Neck: No neck pain  Cardiovascular: + chest pain.  Respiratory: Negative for shortness of breath. Gastrointestinal: Negative for abdominal pain, vomiting or diarrhea. + nausea Genitourinary: Negative for dysuria. Musculoskeletal: Negative for back pain. Skin: Negative for rash. Neurological: Negative for headaches, weakness or numbness. Psych: No SI or HI  ____________________________________________   PHYSICAL EXAM:  VITAL SIGNS: ED Triage Vitals  Enc Vitals Group     BP 10/10/18 1540 (!) 174/78     Pulse Rate 10/10/18 1540 95     Resp  10/10/18 1540 16     Temp 10/10/18 1540 98.6 F (37 C)     Temp Source 10/10/18 1540 Oral     SpO2 10/10/18 1540 96 %     Weight 10/10/18 1539 159 lb (72.1 kg)     Height 10/10/18 1539 5' (1.524 m)     Head Circumference --      Peak Flow --      Pain Score 10/10/18 1539 2     Pain Loc --      Pain Edu? --      Excl. in GC? --     Constitutional: Alert and oriented. Well appearing and in no apparent distress. HEENT:      Head: Normocephalic and atraumatic.         Eyes: Conjunctivae are normal. Sclera is non-icteric.       Mouth/Throat: Mucous membranes are moist.       Neck: Supple with no signs of meningismus. Cardiovascular: Regular rate and rhythm. No murmurs, gallops, or rubs. 2+ symmetrical distal pulses are present in all extremities. No JVD. Respiratory: Normal respiratory effort. Lungs are clear to auscultation bilaterally. No wheezes, crackles, or rhonchi.  Gastrointestinal: Soft, non tender, and non distended with positive bowel sounds. No rebound or guarding. Musculoskeletal: Nontender with normal range of motion in all extremities. No edema, cyanosis, or erythema of extremities. Neurologic: Normal speech and language. Face is symmetric. Moving all extremities. No gross focal neurologic deficits are appreciated. Skin: Skin is warm, dry and intact. No rash noted. Psychiatric: Mood and affect are normal. Speech and behavior are normal.  ____________________________________________   LABS (all labs ordered are listed, but only abnormal results are displayed)  Labs Reviewed  CBC WITH DIFFERENTIAL/PLATELET - Abnormal; Notable for the following components:      Result Value   WBC 10.6 (*)    Platelets 443 (*)    All other components within normal limits  BASIC METABOLIC PANEL - Abnormal; Notable for the following components:   Sodium 131 (*)    Chloride 91 (*)    Glucose, Bld 122 (*)    BUN 27 (*)    Creatinine, Ser 1.40 (*)    GFR calc non Af Amer 39 (*)    GFR  calc Af Amer 46 (*)    All other components within normal limits  TROPONIN I  TROPONIN I   ____________________________________________  EKG  ED ECG REPORT I, Nita Sickle, the attending physician, personally viewed and interpreted this ECG.  Normal sinus rhythm, rate of 93, normal intervals, normal axis, no ST elevations or depressions.  Normal EKG ____________________________________________  RADIOLOGY  I have personally reviewed the images performed during this visit and I agree with the Radiologist's read.   Interpretation by Radiologist:  Dg Chest Portable 1 View  Result Date: 10/10/2018 CLINICAL DATA:  Chest tightness EXAM: PORTABLE CHEST 1 VIEW COMPARISON:  07/10/2012 FINDINGS: Normal heart size. Lungs under aerated and grossly clear. No pneumothorax. No pleural effusion. IMPRESSION: No active disease. Electronically Signed  By: Jolaine ClickArthur  Hoss M.D.   On: 10/10/2018 16:06     ____________________________________________   PROCEDURES  Procedure(s) performed: None Procedures Critical Care performed:  None ____________________________________________   INITIAL IMPRESSION / ASSESSMENT AND PLAN / ED COURSE   68 y.o. female with a history of type 2 diabetes, GERD, hypertension hyperlipidemia who presents for evaluation of several episodes of chest pressure x 3 days.  Patient is well-appearing and in no distress, exam with no acute findings, no leg edema, intact pulses x4, heart regular rate and rhythm, lungs are clear with good air movement.  Ddx ACS vs GERD vs PNA. Low suspicion for PE with intermittent symptoms, no hypoxia, no tachypnea, no tachycardia, no pleuritic chest pain.  Low suspicion for dissection with no neurological deficits, intact and strong pulses x4, no radiation to her back, and intermittent pain.  EKG is normal with no evidence of ischemia.  Labs are pending.  Patient was given a full dose of aspirin and 3 sublingual nitros with improvement of her  blood pressure and resolution of her pain.   _________________________ 8:14 PM on 10/10/2018 -----------------------------------------  Patient remains with no further episodes of pain.  Troponin x2 is negative. Will refer to cardiology for further evaluation. Will send patient home on daily baby aspirin. Discussed return precautions for recurrence of her chest pain or shortness of breath.  Patient referred to Dr. Gwen PoundsKowalski.  As part of my medical decision making, I reviewed the following data within the electronic MEDICAL RECORD NUMBER Nursing notes reviewed and incorporated, Labs reviewed , EKG interpreted , Old chart reviewed, Radiograph reviewed , Notes from prior ED visits and  Controlled Substance Database    Pertinent labs & imaging results that were available during my care of the patient were reviewed by me and considered in my medical decision making (see chart for details).    ____________________________________________   FINAL CLINICAL IMPRESSION(S) / ED DIAGNOSES  Final diagnoses:  Chest pain, unspecified type      NEW MEDICATIONS STARTED DURING THIS VISIT:  ED Discharge Orders         Ordered    aspirin 81 MG tablet  Daily     10/10/18 2013           Note:  This document was prepared using Dragon voice recognition software and may include unintentional dictation errors.    Don PerkingVeronese, [place], MD 10/10/18 2015

## 2019-02-22 ENCOUNTER — Other Ambulatory Visit: Payer: Self-pay | Admitting: Family Medicine

## 2019-02-22 DIAGNOSIS — Z1231 Encounter for screening mammogram for malignant neoplasm of breast: Secondary | ICD-10-CM

## 2020-01-01 ENCOUNTER — Other Ambulatory Visit: Payer: Self-pay | Admitting: Nephrology

## 2020-01-01 DIAGNOSIS — N1832 Chronic kidney disease, stage 3b: Secondary | ICD-10-CM

## 2020-01-01 DIAGNOSIS — E1122 Type 2 diabetes mellitus with diabetic chronic kidney disease: Secondary | ICD-10-CM

## 2020-01-01 DIAGNOSIS — R809 Proteinuria, unspecified: Secondary | ICD-10-CM

## 2020-01-14 ENCOUNTER — Encounter (INDEPENDENT_AMBULATORY_CARE_PROVIDER_SITE_OTHER): Payer: Self-pay

## 2020-01-14 ENCOUNTER — Ambulatory Visit
Admission: RE | Admit: 2020-01-14 | Discharge: 2020-01-14 | Disposition: A | Discharge status: Home / Self Care | Payer: Medicare Other | Source: Ambulatory Visit | Attending: Nephrology | Admitting: Nephrology

## 2020-01-14 ENCOUNTER — Other Ambulatory Visit: Payer: Self-pay

## 2020-01-14 DIAGNOSIS — E1122 Type 2 diabetes mellitus with diabetic chronic kidney disease: Secondary | ICD-10-CM

## 2020-01-14 DIAGNOSIS — N1832 Chronic kidney disease, stage 3b: Secondary | ICD-10-CM | POA: Diagnosis present

## 2020-01-14 DIAGNOSIS — R809 Proteinuria, unspecified: Secondary | ICD-10-CM | POA: Diagnosis present

## 2020-01-14 DIAGNOSIS — E1121 Type 2 diabetes mellitus with diabetic nephropathy: Secondary | ICD-10-CM | POA: Insufficient documentation

## 2020-04-20 IMAGING — DX PORTABLE CHEST - 1 VIEW
1 series · 1 of 1 positions shown · non-contrast
Comparison: 07/10/2012

CLINICAL DATA: Chest tightness

EXAM:
PORTABLE CHEST 1 VIEW

[chest ap]
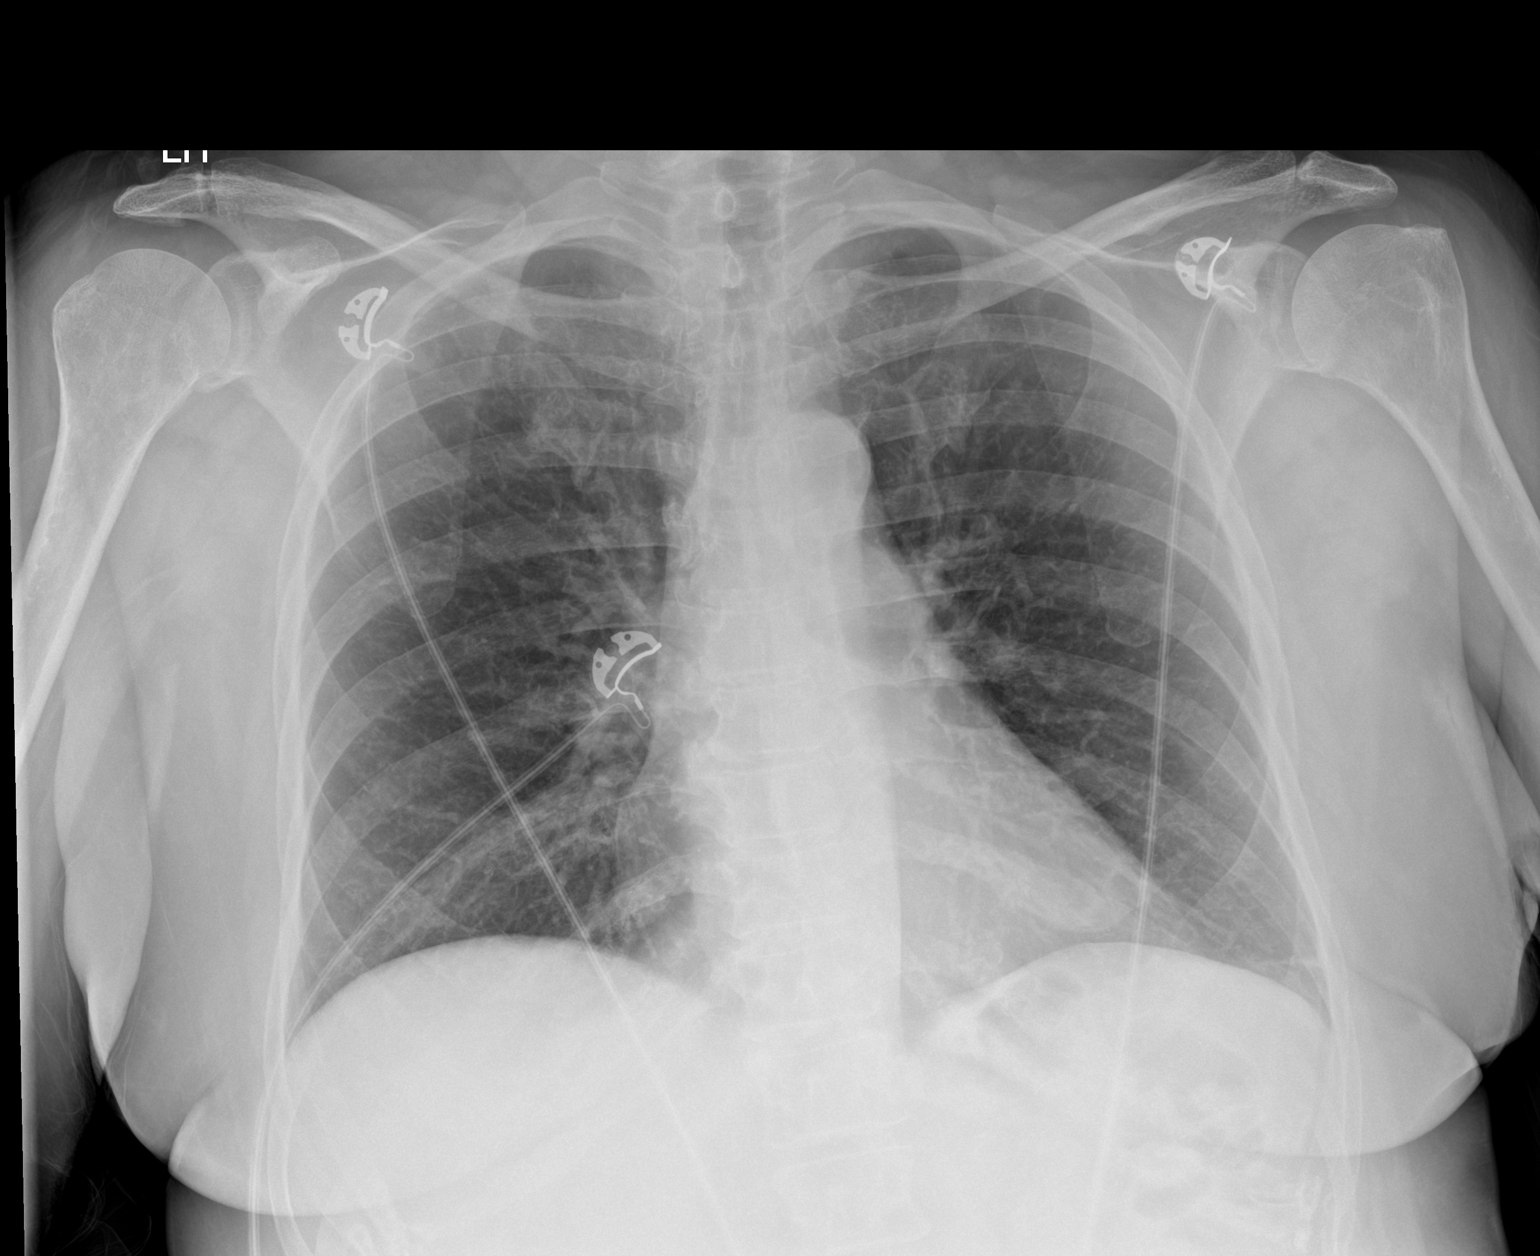

[1 of 1 positions shown; findings below may reference images not displayed]

FINDINGS: Normal heart size. Lungs under aerated and grossly clear. No
pneumothorax. No pleural effusion.
IMPRESSION: No active disease.

## 2020-08-25 ENCOUNTER — Other Ambulatory Visit: Payer: Self-pay | Admitting: Family Medicine

## 2020-08-25 DIAGNOSIS — Z1231 Encounter for screening mammogram for malignant neoplasm of breast: Secondary | ICD-10-CM

## 2023-10-03 ENCOUNTER — Other Ambulatory Visit: Payer: Self-pay | Admitting: Family Medicine

## 2023-10-03 DIAGNOSIS — Z1231 Encounter for screening mammogram for malignant neoplasm of breast: Secondary | ICD-10-CM
